# Patient Record
Sex: Male | Born: 1945 | Race: White | Hispanic: No | Marital: Married | Smoking: Never smoker
Health system: Southern US, Community
[De-identification: ages and names within clinical notes are randomized; demographics above are authoritative.]

---

## 2000-02-15 ENCOUNTER — Encounter: Payer: Self-pay | Admitting: Pediatrics

## 2000-02-15 ENCOUNTER — Encounter: Payer: Self-pay | Admitting: Emergency Medicine

## 2000-02-15 ENCOUNTER — Emergency Department (HOSPITAL_COMMUNITY): Admission: EM | Admit: 2000-02-15 | Discharge: 2000-02-15 | Payer: Self-pay | Admitting: Emergency Medicine

## 2004-04-12 ENCOUNTER — Encounter: Admission: RE | Admit: 2004-04-12 | Discharge: 2004-04-12 | Payer: Self-pay | Admitting: Internal Medicine

## 2004-06-15 ENCOUNTER — Encounter (INDEPENDENT_AMBULATORY_CARE_PROVIDER_SITE_OTHER): Payer: Self-pay | Admitting: Specialist

## 2004-06-15 ENCOUNTER — Observation Stay (HOSPITAL_COMMUNITY): Admission: RE | Admit: 2004-06-15 | Discharge: 2004-06-16 | Payer: Self-pay | Admitting: Surgery

## 2005-02-21 ENCOUNTER — Ambulatory Visit: Payer: Self-pay | Admitting: Psychology

## 2005-03-15 ENCOUNTER — Ambulatory Visit: Payer: Self-pay | Admitting: Psychology

## 2005-04-02 ENCOUNTER — Ambulatory Visit: Payer: Self-pay | Admitting: Psychology

## 2005-04-17 ENCOUNTER — Ambulatory Visit: Payer: Self-pay | Admitting: Psychology

## 2005-05-15 ENCOUNTER — Ambulatory Visit: Payer: Self-pay | Admitting: Psychology

## 2005-06-03 IMAGING — US US ABDOMEN COMPLETE
1 series · 13 of 25 positions shown · IV contrast (agent unspecified)
Comparison: none

CLINICAL DATA: Abdominal pain.
ULTRASOUND OF THE ABDOMEN COMPLETE 
3.5 cm shadowing gallstone is seen.  At the gallbladder fundus is a nonmobile 1.3 cm polypoid focus measuring up to 1.3 cm.  This may represent large gallbladder polyp/adenoma although nonspecific.  No invasion of adjacent structures to support primary gallbladder carcinoma is seen.  Gallbladder wall thickness is normal at 2 mm with no sonographic Murphy?s sign.  No dilated intrahepatic nor extrahepatic bile ducts are seen with common bile duct measuring up to 3 mm.  Inferior vena cava is limited for assessment.  Liver, pancreas, abdominal aorta are sonographically normal with maximal abdominal aortic diameter 1.8 cm.  The spleen measures 9 x 12.1 cm with tiny scattered shadowing probable benign calcified granulomata.  The right kidney measures 10.6 cm long with the left kidney measuring 11.2 cm long with no hydronephrosis.  12 mm upper pole right renal cyst and upper pole left renal cyst measuring up to 5.5 cm shows slight internal echoes and is exophytic. 
IMPRESSION
1.  3.5 cm shadowing gallstone with likely gallbladder fundal 1.3 cm soft tissue polypoid nonspecific focus. 
2.  Bilateral renal cysts slightly complex at the upper pole left.  Followup targeted renal ultrasound in six months or abdominal CT pre- and post-contrast are advised for further confirmation of this finding.  
3.  Slight limitation of the inferior vena cava assessment with likely benign splenic granulomata.  
4.  Otherwise normal.

[Series 1: unknown · 0.27mm/px · 13 of 79 slices shown]
[im 1/79]
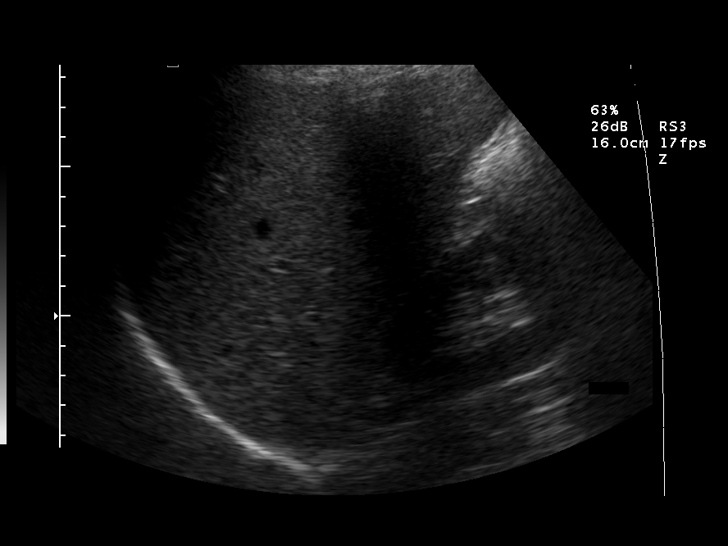
[im 7/79]
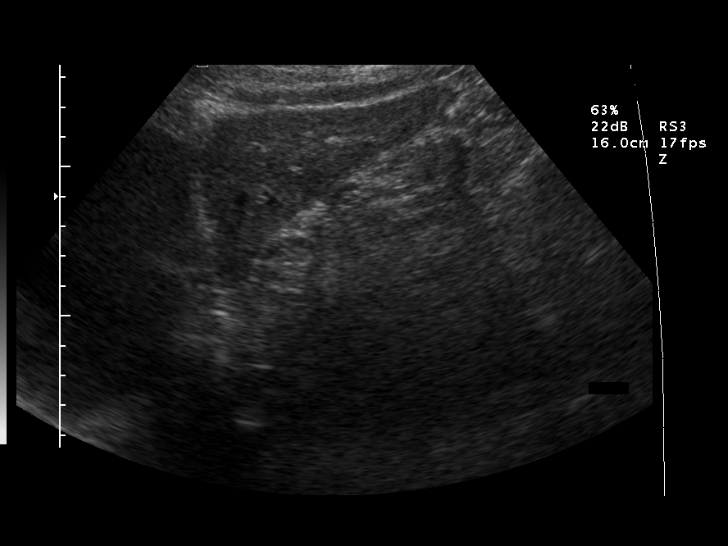
[im 14/79]
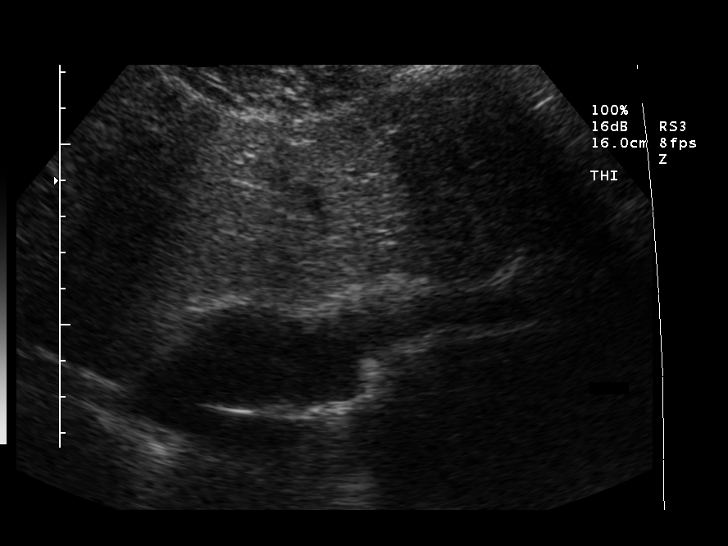
[im 20/79]
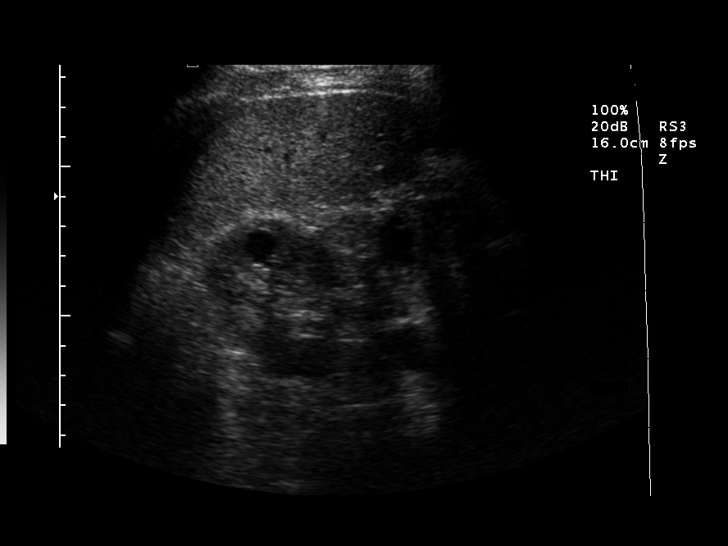
[im 27/79]
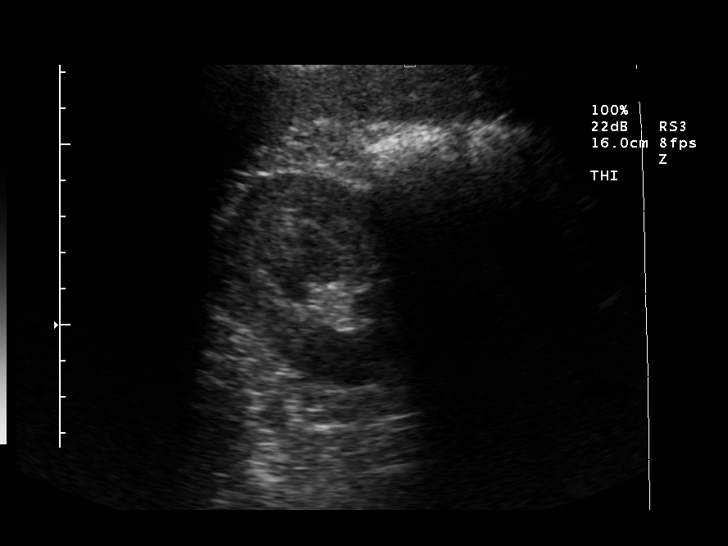
[im 33/79]
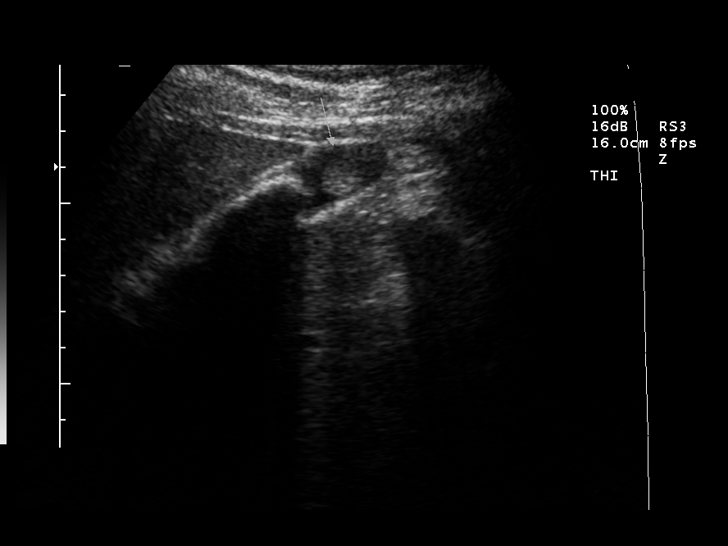
[im 40/79]
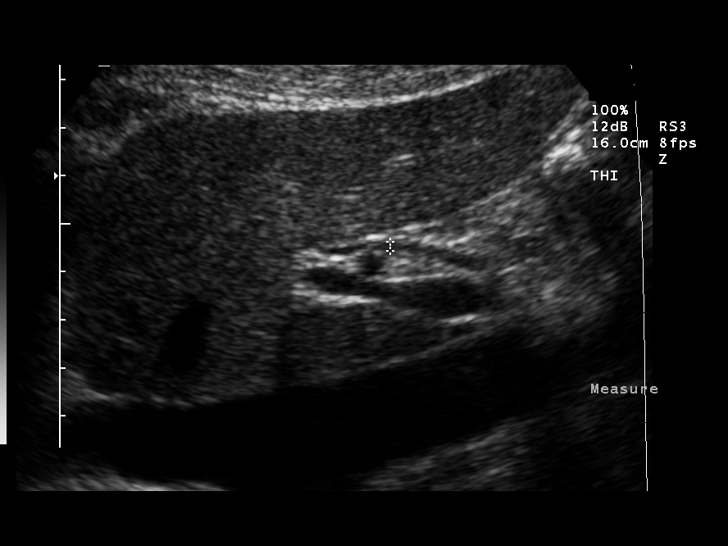
[im 46/79]
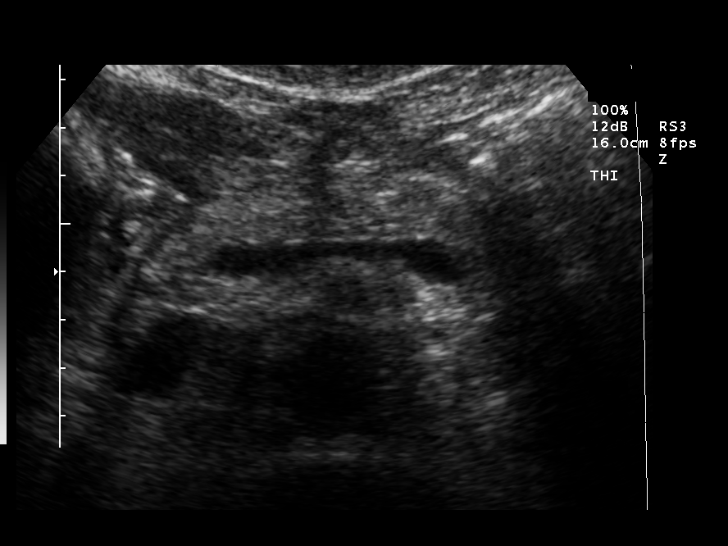
[im 53/79]
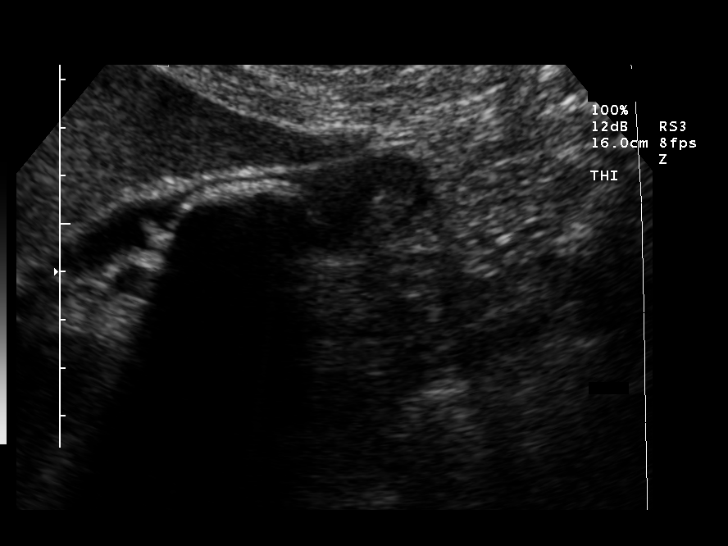
[im 59/79]
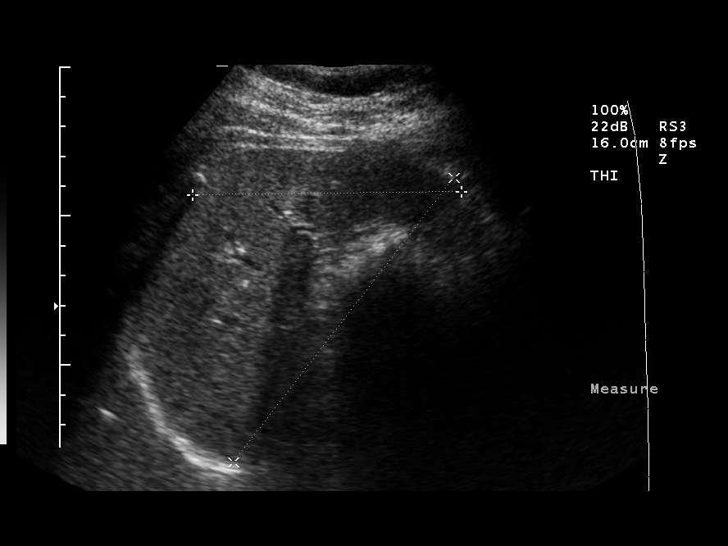
[im 66/79]
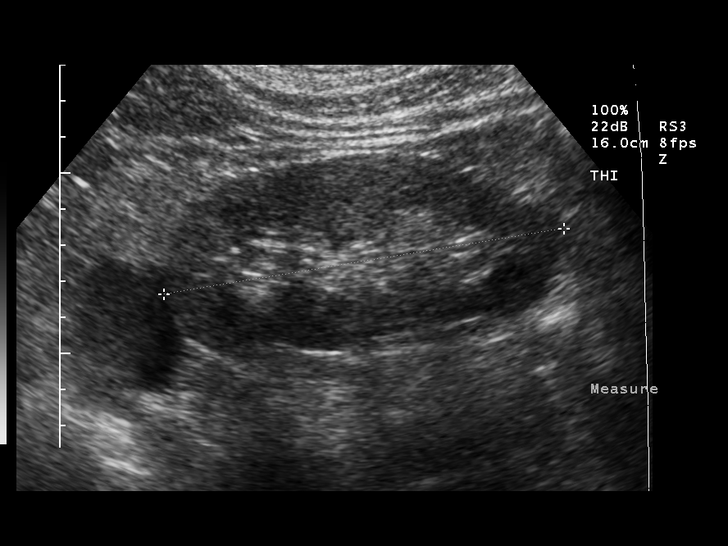
[im 72/79]
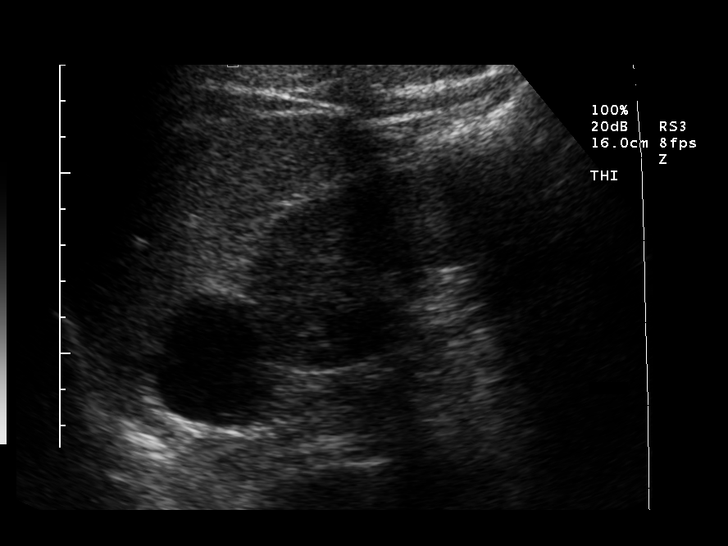
[im 79/79]
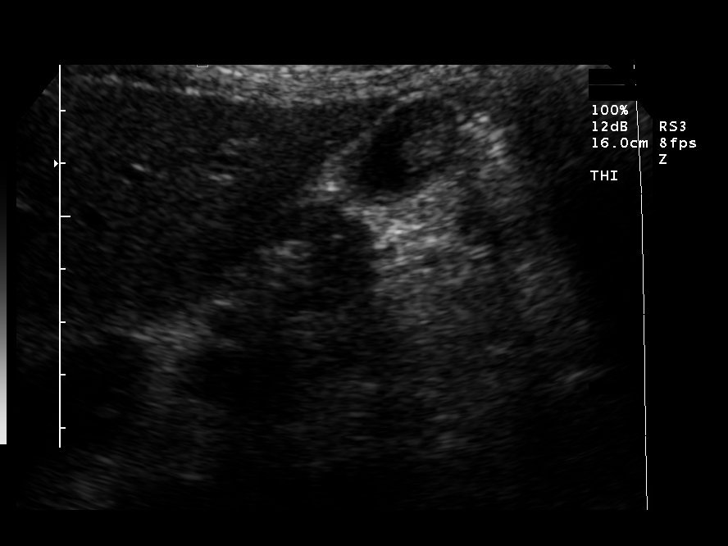

[13 of 25 positions shown; findings below may reference images not displayed]

## 2005-06-12 ENCOUNTER — Ambulatory Visit: Payer: Self-pay | Admitting: Psychology

## 2005-07-03 ENCOUNTER — Ambulatory Visit: Payer: Self-pay | Admitting: Psychology

## 2005-07-31 ENCOUNTER — Ambulatory Visit: Payer: Self-pay | Admitting: Psychology

## 2005-08-06 IMAGING — RF DG CHOLANGIOGRAM OPERATIVE
1 series · 24 of 24 positions shown · non-contrast
Comparison: none

CLINICAL DATA: Symptomatic gallstones.  

INTRAOPERATIVE CHOLANGIOGRAM:
TECHNIQUE: Multiple fluoroscopic spot radiographs were obtained during
intraoperative cholangiogram, and are submitted for interpretation
postoperatively.

[Series 1: run · 24 of 53 slices shown]
[im 1/53]
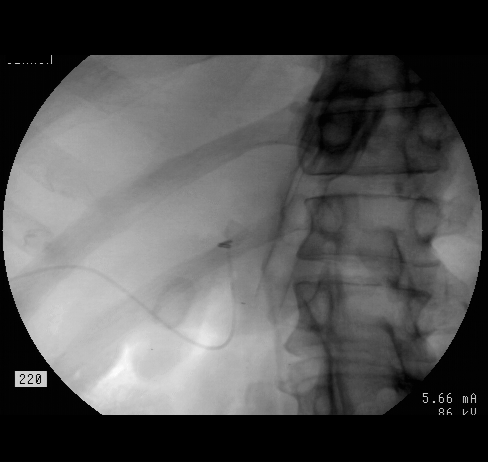
[im 3/53]
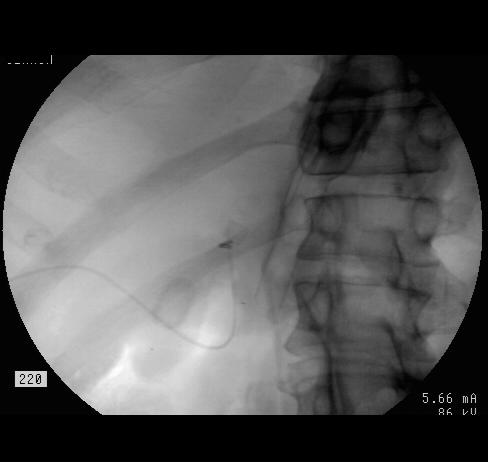
[im 5/53]
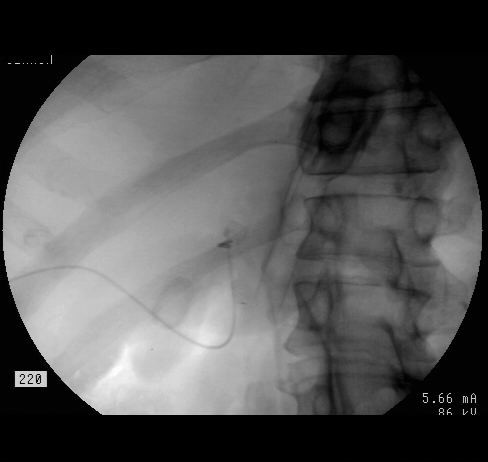
[im 7/53]
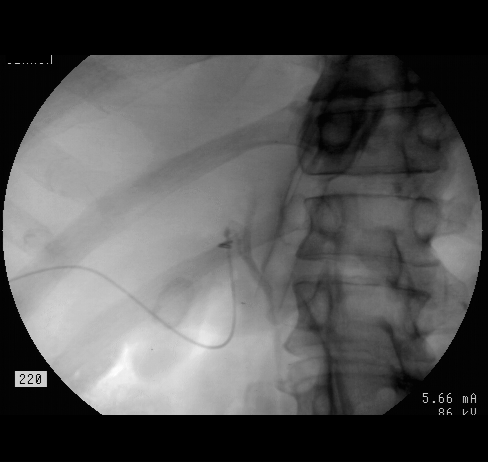
[im 10/53]
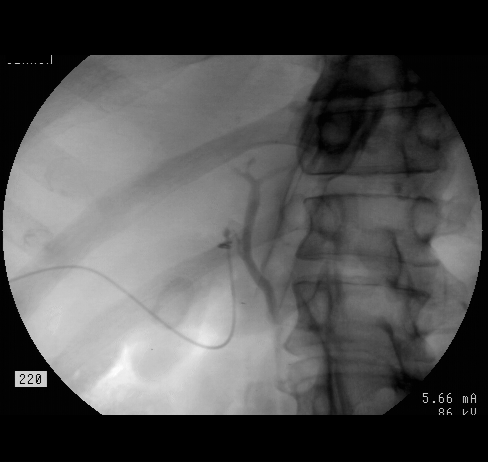
[im 12/53]
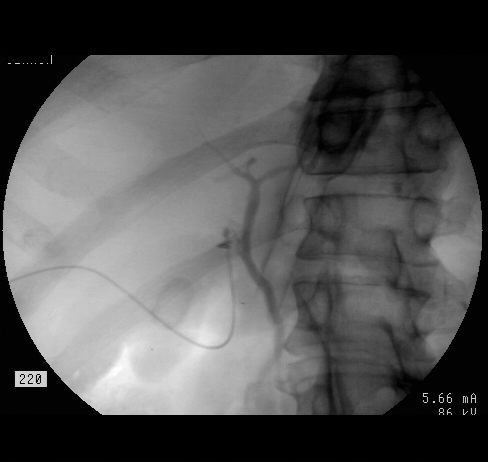
[im 14/53]
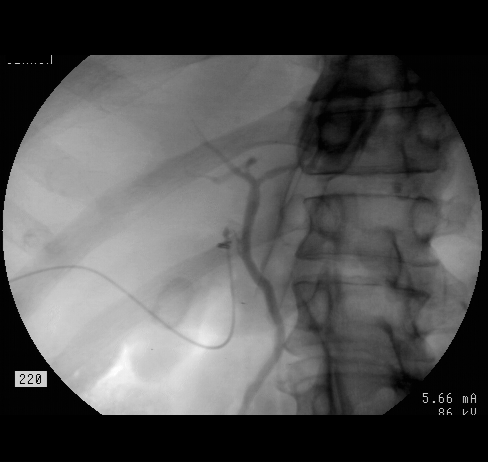
[im 16/53]
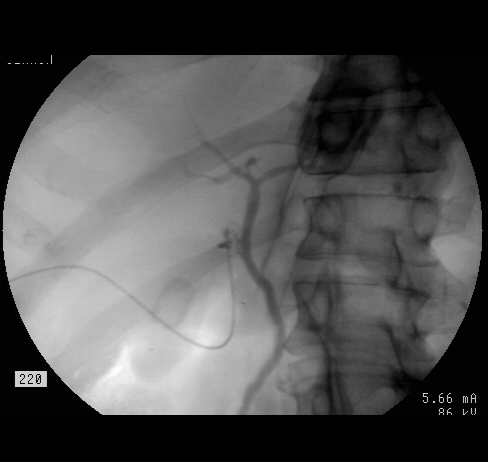
[im 19/53]
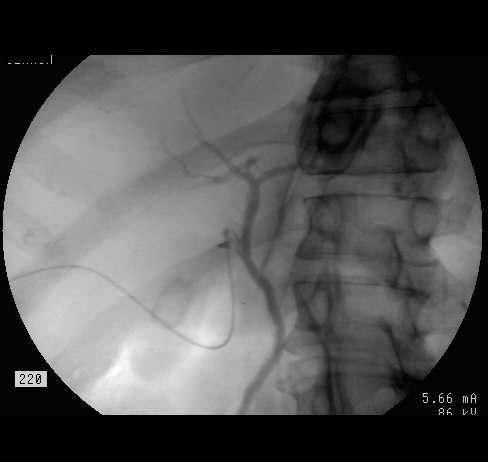
[im 21/53]
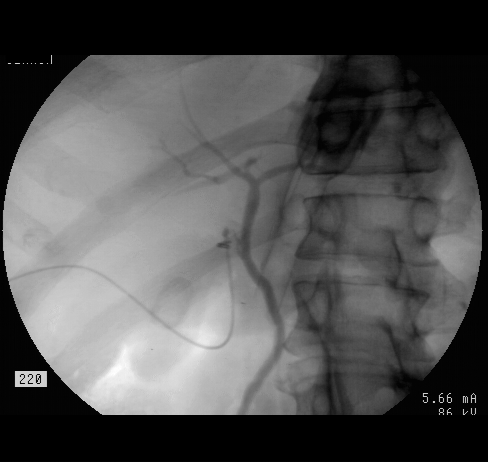
[im 23/53]
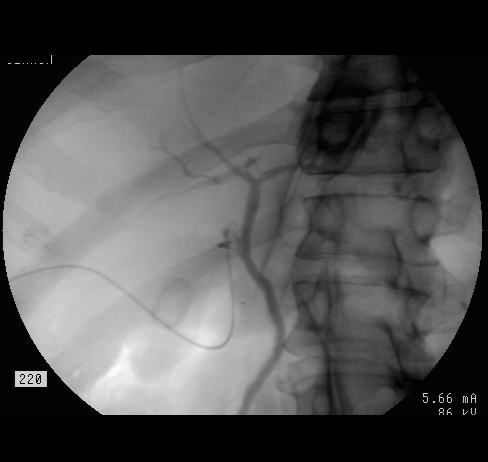
[im 25/53]
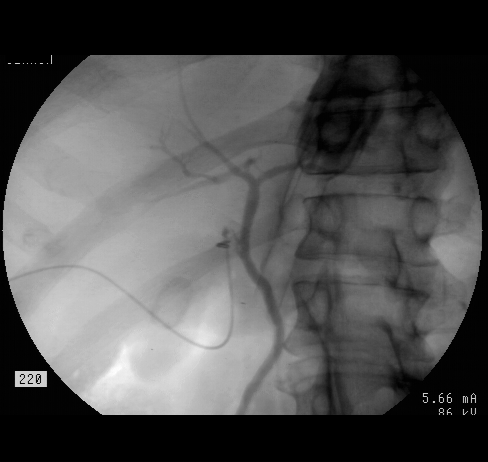
[im 28/53]
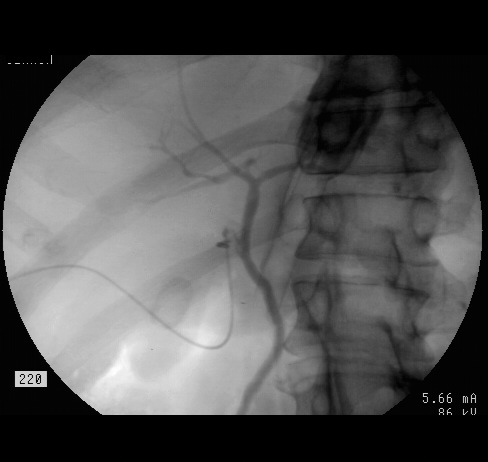
[im 30/53]
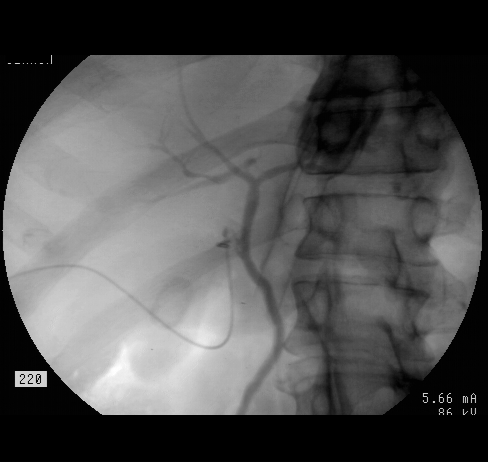
[im 32/53]
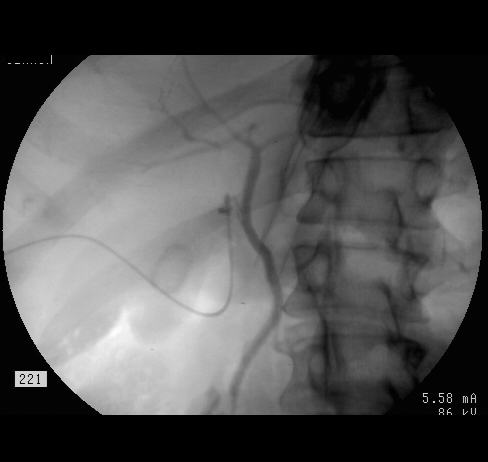
[im 34/53]
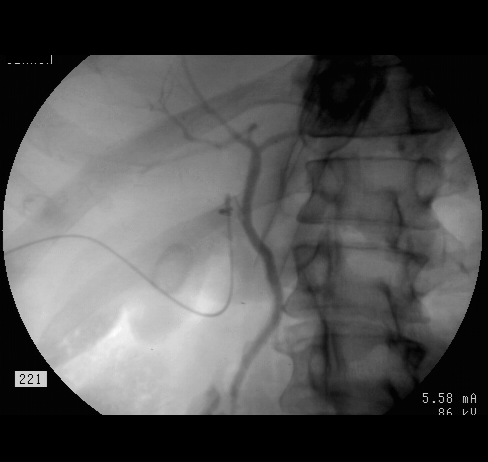
[im 37/53]
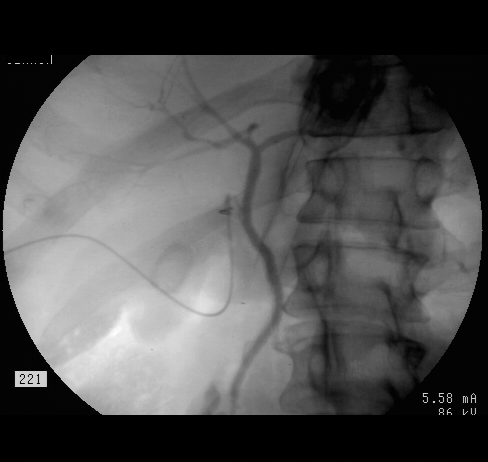
[im 39/53]
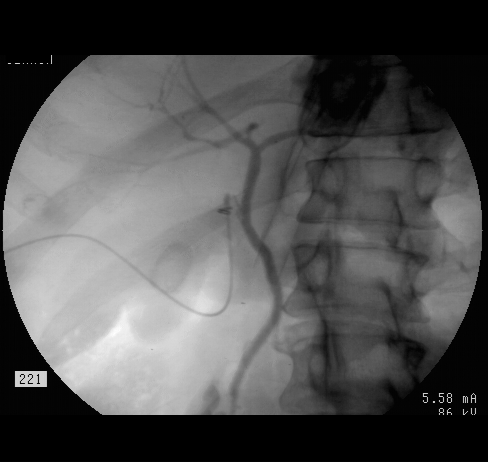
[im 41/53]
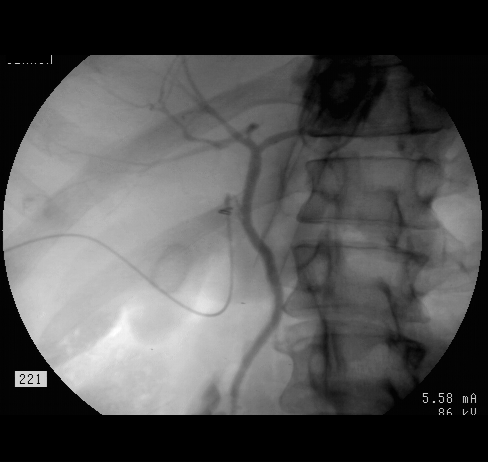
[im 43/53]
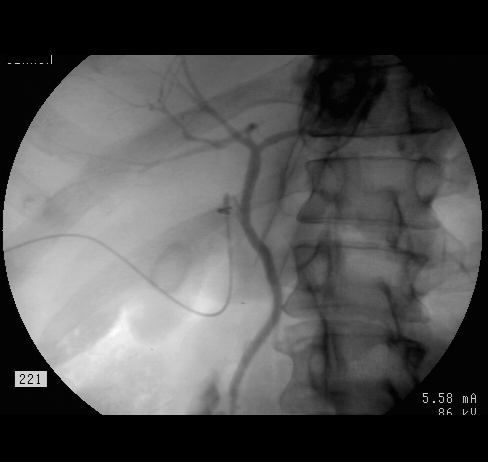
[im 46/53]
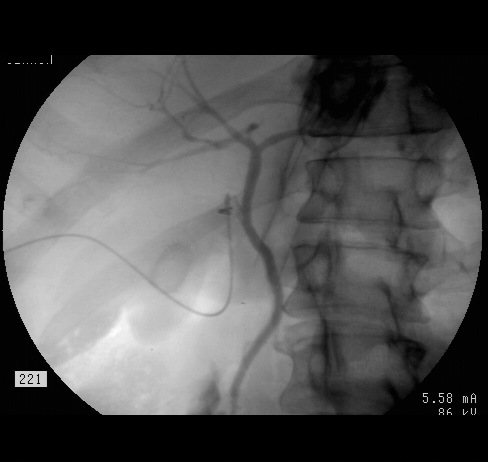
[im 48/53]
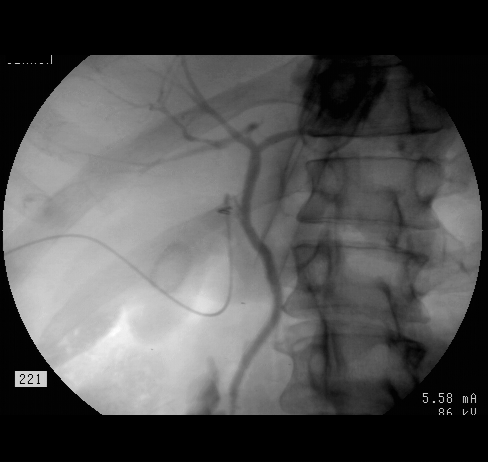
[im 50/53]
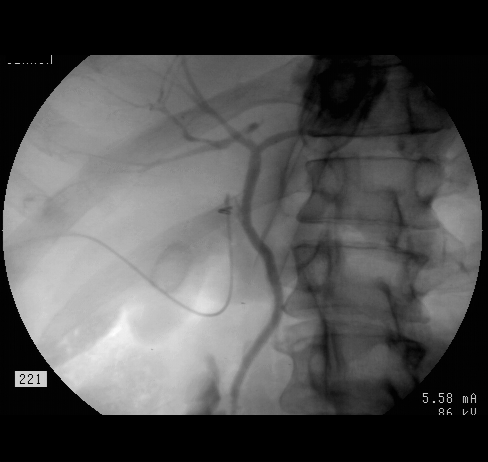
[im 53/53]
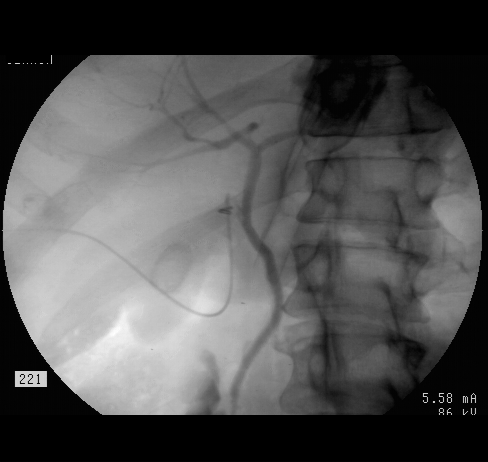

[24 of 24 positions shown; findings below may reference images not displayed]

FINDINGS: No calculi are identified within the common bile duct.  There is no
evidence of biliary stricture or obstruction.
IMPRESSION: Negative intraoperative cholangiogram.

## 2005-08-19 ENCOUNTER — Ambulatory Visit: Payer: Self-pay | Admitting: Psychology

## 2005-09-02 ENCOUNTER — Ambulatory Visit: Payer: Self-pay | Admitting: Psychology

## 2005-09-16 ENCOUNTER — Ambulatory Visit: Payer: Self-pay | Admitting: Psychology

## 2005-09-30 ENCOUNTER — Ambulatory Visit: Payer: Self-pay | Admitting: Psychology

## 2005-10-30 ENCOUNTER — Ambulatory Visit: Payer: Self-pay | Admitting: Psychology

## 2005-11-25 ENCOUNTER — Ambulatory Visit: Payer: Self-pay | Admitting: Psychology

## 2005-12-03 ENCOUNTER — Ambulatory Visit: Payer: Self-pay | Admitting: Psychology

## 2005-12-09 ENCOUNTER — Ambulatory Visit: Payer: Self-pay | Admitting: Psychology

## 2005-12-30 ENCOUNTER — Ambulatory Visit: Payer: Self-pay | Admitting: Psychology

## 2006-01-06 ENCOUNTER — Ambulatory Visit: Payer: Self-pay | Admitting: Psychology

## 2006-01-20 ENCOUNTER — Ambulatory Visit: Payer: Self-pay | Admitting: Psychology

## 2006-02-19 ENCOUNTER — Ambulatory Visit: Payer: Self-pay | Admitting: Psychology

## 2006-03-03 ENCOUNTER — Ambulatory Visit: Payer: Self-pay | Admitting: Psychology

## 2006-03-17 ENCOUNTER — Ambulatory Visit: Payer: Self-pay | Admitting: Psychology

## 2006-04-28 ENCOUNTER — Ambulatory Visit: Payer: Self-pay | Admitting: Psychology

## 2006-05-12 ENCOUNTER — Ambulatory Visit: Payer: Self-pay | Admitting: Psychology

## 2006-05-26 ENCOUNTER — Ambulatory Visit: Payer: Self-pay | Admitting: Psychology

## 2006-06-23 ENCOUNTER — Ambulatory Visit: Payer: Self-pay | Admitting: Psychology

## 2006-07-09 ENCOUNTER — Ambulatory Visit: Payer: Self-pay | Admitting: Psychology

## 2006-08-04 ENCOUNTER — Ambulatory Visit: Payer: Self-pay | Admitting: Psychology

## 2006-08-27 ENCOUNTER — Ambulatory Visit: Payer: Self-pay | Admitting: Psychology

## 2006-09-17 ENCOUNTER — Ambulatory Visit: Payer: Self-pay | Admitting: Psychology

## 2006-10-01 ENCOUNTER — Ambulatory Visit: Payer: Self-pay | Admitting: Psychology

## 2006-10-15 ENCOUNTER — Ambulatory Visit: Payer: Self-pay | Admitting: Psychology

## 2006-10-29 ENCOUNTER — Ambulatory Visit: Payer: Self-pay | Admitting: Psychology

## 2006-11-12 ENCOUNTER — Ambulatory Visit: Payer: Self-pay | Admitting: Psychology

## 2006-11-26 ENCOUNTER — Ambulatory Visit: Payer: Self-pay | Admitting: Psychology

## 2006-12-10 ENCOUNTER — Ambulatory Visit: Payer: Self-pay | Admitting: Psychology

## 2006-12-24 ENCOUNTER — Ambulatory Visit: Payer: Self-pay | Admitting: Psychology

## 2007-01-07 ENCOUNTER — Ambulatory Visit: Payer: Self-pay | Admitting: Psychology

## 2007-02-04 ENCOUNTER — Ambulatory Visit: Payer: Self-pay | Admitting: Psychology

## 2007-02-18 ENCOUNTER — Ambulatory Visit: Payer: Self-pay | Admitting: Psychology

## 2007-03-04 ENCOUNTER — Ambulatory Visit: Payer: Self-pay | Admitting: Psychology

## 2007-03-18 ENCOUNTER — Ambulatory Visit: Payer: Self-pay | Admitting: Psychology

## 2007-04-21 ENCOUNTER — Ambulatory Visit: Payer: Self-pay | Admitting: Psychology

## 2007-05-19 ENCOUNTER — Ambulatory Visit: Payer: Self-pay | Admitting: Psychology

## 2007-06-02 ENCOUNTER — Ambulatory Visit: Payer: Self-pay | Admitting: Psychology

## 2007-06-16 ENCOUNTER — Ambulatory Visit: Payer: Self-pay | Admitting: Psychology

## 2007-07-14 ENCOUNTER — Ambulatory Visit: Payer: Self-pay | Admitting: Psychology

## 2007-07-28 ENCOUNTER — Ambulatory Visit: Payer: Self-pay | Admitting: Psychology

## 2007-08-25 ENCOUNTER — Ambulatory Visit: Payer: Self-pay | Admitting: Psychology

## 2007-09-16 ENCOUNTER — Ambulatory Visit: Payer: Self-pay | Admitting: Psychology

## 2007-09-22 ENCOUNTER — Ambulatory Visit: Payer: Self-pay | Admitting: Psychology

## 2007-10-14 ENCOUNTER — Ambulatory Visit: Payer: Self-pay | Admitting: Psychology

## 2007-11-18 ENCOUNTER — Ambulatory Visit: Payer: Self-pay | Admitting: Psychology

## 2007-12-02 ENCOUNTER — Ambulatory Visit: Payer: Self-pay | Admitting: Psychology

## 2007-12-16 ENCOUNTER — Ambulatory Visit: Payer: Self-pay | Admitting: Psychology

## 2007-12-30 ENCOUNTER — Ambulatory Visit: Payer: Self-pay | Admitting: Psychology

## 2008-01-13 ENCOUNTER — Ambulatory Visit: Payer: Self-pay | Admitting: Psychology

## 2008-01-27 ENCOUNTER — Ambulatory Visit: Payer: Self-pay | Admitting: Psychology

## 2008-03-09 ENCOUNTER — Ambulatory Visit: Payer: Self-pay | Admitting: Psychology

## 2008-03-23 ENCOUNTER — Ambulatory Visit: Payer: Self-pay | Admitting: Psychology

## 2008-04-20 ENCOUNTER — Ambulatory Visit: Payer: Self-pay | Admitting: Psychology

## 2008-05-02 ENCOUNTER — Ambulatory Visit: Payer: Self-pay | Admitting: Psychology

## 2008-05-18 ENCOUNTER — Ambulatory Visit: Payer: Self-pay | Admitting: Psychology

## 2008-05-30 ENCOUNTER — Ambulatory Visit: Payer: Self-pay | Admitting: Psychology

## 2008-06-30 ENCOUNTER — Ambulatory Visit: Payer: Self-pay | Admitting: Psychology

## 2008-07-11 ENCOUNTER — Ambulatory Visit: Payer: Self-pay | Admitting: Psychology

## 2008-07-25 ENCOUNTER — Ambulatory Visit: Payer: Self-pay | Admitting: Psychology

## 2008-08-08 ENCOUNTER — Ambulatory Visit: Payer: Self-pay | Admitting: Psychology

## 2008-08-22 ENCOUNTER — Ambulatory Visit: Payer: Self-pay | Admitting: Psychology

## 2008-09-27 ENCOUNTER — Ambulatory Visit: Payer: Self-pay | Admitting: Psychology

## 2008-10-17 ENCOUNTER — Ambulatory Visit: Payer: Self-pay | Admitting: Psychology

## 2008-11-14 ENCOUNTER — Ambulatory Visit: Payer: Self-pay | Admitting: Psychology

## 2008-11-30 ENCOUNTER — Ambulatory Visit: Payer: Self-pay | Admitting: Psychology

## 2008-12-14 ENCOUNTER — Ambulatory Visit: Payer: Self-pay | Admitting: Psychology

## 2008-12-29 ENCOUNTER — Ambulatory Visit: Payer: Self-pay | Admitting: Psychology

## 2009-01-10 ENCOUNTER — Ambulatory Visit: Payer: Self-pay | Admitting: Psychology

## 2009-01-26 ENCOUNTER — Ambulatory Visit: Payer: Self-pay | Admitting: Psychology

## 2009-02-08 ENCOUNTER — Ambulatory Visit: Payer: Self-pay | Admitting: Psychology

## 2009-02-22 ENCOUNTER — Ambulatory Visit: Payer: Self-pay | Admitting: Psychology

## 2009-03-06 ENCOUNTER — Ambulatory Visit: Payer: Self-pay | Admitting: Psychology

## 2009-03-20 ENCOUNTER — Ambulatory Visit: Payer: Self-pay | Admitting: Psychology

## 2009-04-19 ENCOUNTER — Ambulatory Visit: Payer: Self-pay | Admitting: Psychology

## 2009-05-17 ENCOUNTER — Ambulatory Visit: Payer: Self-pay | Admitting: Psychology

## 2009-05-31 ENCOUNTER — Ambulatory Visit: Payer: Self-pay | Admitting: Psychology

## 2009-07-26 ENCOUNTER — Ambulatory Visit: Payer: Self-pay | Admitting: Psychology

## 2009-08-21 ENCOUNTER — Ambulatory Visit: Payer: Self-pay | Admitting: Psychology

## 2009-09-18 ENCOUNTER — Ambulatory Visit: Payer: Self-pay | Admitting: Psychology

## 2009-10-02 ENCOUNTER — Ambulatory Visit: Payer: Self-pay | Admitting: Psychology

## 2009-10-30 ENCOUNTER — Ambulatory Visit: Payer: Self-pay | Admitting: Psychology

## 2009-11-30 ENCOUNTER — Ambulatory Visit (HOSPITAL_COMMUNITY): Admission: RE | Admit: 2009-11-30 | Discharge: 2009-11-30 | Payer: Self-pay | Admitting: Gastroenterology

## 2009-12-11 ENCOUNTER — Ambulatory Visit: Payer: Self-pay | Admitting: Psychology

## 2010-01-08 ENCOUNTER — Ambulatory Visit: Payer: Self-pay | Admitting: Psychology

## 2010-12-14 NOTE — Consult Note (Signed)
[facility]  Patient:    Carlos Mason, Carlos Mason                         MRN: [id number] Proc. Date: 02/15/00 Adm. Date:  [id number] Attending:  Mick Sell CC:         Pearla Dubonnet, M.D.                          Consultation Report  DATE OF BIRTH:  [birth year]  CHIEF COMPLAINT:  Slurred speech, unsteady gait.  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 The patient has had labile hypertension and depression.  He had 5 hours of sleep last night and got up early to drive to [place] for the weekend wit his brother.  His wife and son were in the car.  Less than an hour into the trip his wife and son had fallen asleep and he pulled over to tell her that he felt very tired and was unable to drive.  They switched places and she noted that his speech was slurred and that he seemed to confused.  He continued to ask "are at we at the [facility] yet"?  His wife became alarmed and turned the car around.  At that point he asked if the family was going to [place].  When the patient came to [facility] he seemed to be unsteady on his feet and had slurred speech.  He was evaluated by the emergency department physician who found evidence of dysarthria, difficulty following instructions, somnolence, but no focal deficits.  The patient had a CT scan of the brain which I have evaluated personally and was normal.  I was asked to see him and recommended code stroke status with work-up for evidence of possible cerebrovascular accident.  Through the course of the morning, the patient has become more lucid.  He still shows some evidence of problems with distractability, but these are premorbid characteristics.  He also shows some mild left facial weakness which he says and he dates this condition by about 30 years.  He has become steady on  his feet and did well on a mini-mental status examination.  PAST MEDICAL HISTORY:  Remarkable for depression, treated with Wellbutrin SR and more recently with clonazepam for side effects of early anxiety.  Patient had palpitation which have been treated in the past with a beta blocker though he is not currently treated with that.  The patient denies recent use of tobacco (quit 15 years ago), alcohol and does not have persistent hypertension, diabetes or hypercholesterolemia.  The patient exercises by running 5 days a week.  SURGICAL HISTORY:  Tonsillectomy age 25.  REVIEW OF SYSTEMS:  The patient has not had until today fatigability or weakness, change in weight or appetite, problems with sleep although he frequently sleeps less than he needs.  He has had no intercurrent infections and specifically no fevers, chills, night sweats.  He has not complained of headache, nausea or vomiting, diarrhea, shortness of breath or dyspnea nor has he had cough.  No chest pain palpitations.  No nausea or vomiting, diarrhea. No change in bowel or bladder function.  No bleeding.  He has chronic back pain.  Neurologically he has had no other complaints except for those noted above.  CURRENT MEDICATIONS: 1. Wellbutrin  SR 150 in the morning SR 100 in the evening. 2. Clonopin 0.25 mg 6 times a day. 3. P.r.n. Claritin and Nasacort both of which he took today. 4. He also has taken a beta blocker in the past for palpitations.  FAMILY HISTORY:  Mother died of Alzheimers disease.  She also had hypertension.  Paternal grandfather died of a stroke.  Maternal grandmother died of some form of infection.  The patients father died of emphysema at 30. Paternal grandfather died in his 66s and paternal grandmother died in her 85s of unknown causes.  He has a son 65.  SOCIAL HISTORY:  The patient lives in [place] and works as a Paramedic at [employer].  Lives with his wife and son.  Again, no  recent use of tobacco or alcohol.  He has occasional use of caffeine.  He does not use recreational drugs.  PHYSICAL EXAMINATION:  GENERAL:  This is a pleasant gentleman in no acute distress.  Lying on a stretcher.  VITAL SIGNS:  Temperature 98.1, blood pressure 158/82, resting pulse 88 and respirations 19.  Pulse oximetry 98%.  Height 5 feet 9 inches.  WEight 182 pounds.  HEENT:  No signs of infection.  NECK:  Supple.  A slight decreased range of motion but no meningismus.  No cranial or cervical bruits.  LUNGS:  Clear to auscultation.  HEART:  No murmurs.  Pulse is normal.  Regular rate and rhythm.  ABDOMEN:  Soft, nontender, bowel sounds normal.  EXTREMITIES:  Well formed without edema, cyanosis, alterations in tone or tight heel cords.  NEUROLOGIC:  Mental status:  The patient was awake, alert, attentive, and appropriate.  He could recall 3/3 objects at 30 seconds, 2/3 at 5 minutes.  He could spell the word "world" backwards and forwards correctly.  He was able to subtract 7s serially from 100 with one error which he was able to self correct.  The patient was able to name objects, repeat phrases, read and follow phrases, perform 3 step commands, make constructions and write a sentence.  His mini-mental status exam was at least 29 out of 30.  CRANIAL NERVE EXAMINATION:  Round, reactive pupils, normal fundi, visual fields full to double simultaneous stimuli.  Extraocular movements full and conjugate.  Okay in responses equal bilaterally.  He has some mild left central 7th which he says is at least 66 years old, symmetric facial strength, midline tongue and uvula, air conduction greater than bone conduction bilaterally.  Motor examination:  The patient showed normal strength, tone and mass fine motor movements, no pronator drift.  Sensation intact to cold, vibration, stereoagnosis.  Cerebellar examination good finger-to-nose and rapid repetitive movements.  Gait and  station was normal.  Patient was able to walk on heels and toes and form a tandem without falling.  He could get up on his  heels and toes.  Romberg responses negative.  Deep tendon reflexes are symmetric, normal at the biceps, patella, diminished elsewhere. The patient had bilateral flexor plantar responses.  IMPRESSION:  Delirium, unknown etiology.  The patient had slurred speech, unsteady gait but no focal or lateralized findings.  This could represent a vertebrobasilar TIA, a postictal state (no seizures were seen), drug induced toxic condition.  Family is uncertain that he did not take too many medicines. I suppose he could have just been extremely tired although I like that explanation less than any of the others.  I reviewed an MRI scan of the brain without and with  contrast personally and find it to be normal.  MR angiogram is also normal.  The patient had somewhat prominent jugular veins but we had this reviewed with a radiologist who agreed that no further work-up in this area was necessary.  CBC shows a normal sinus rhythm with occasional PVCs, left atrial enlargement, rightward axis and an incomplete bundle branch block.  White blood cell count 8,700, hemoglobin 13.9, hematocrit 38.8, MCV 82.8, platelet count 257,000.  Neutrophils 69, lymphocytes 20, monocytes 6, eosinophils 2, basophils 0, LUC 3, sodium 147, potassium 4.0, chloride 99, CO2 28, glucose 126, BUN 20, creatinine 1.1, calcium 9.0, total protein 7.1, albumin 4.2, SGOT 36, SGPT 29, alk. phos. 24, total bilirubin 1.2,.  DISPOSITION:  The patient is discharged home in improved condition.  I have discussed with his wife the need to bring him back for further evaluation should delirium occur.  Under those circumstances in all likelihood we would carry out an EEG.  We will carry out a 2-D echocardiogram as an outpatient. He will start on aspirin 325 mg per day.  FOLLOWUP:  A return visit to see Pearla Dubonnet, M.D. for further followup and I will see the patient at Dr. Kevan Ny request should be there any change in his condition.  This has been discussed with the patient and his wife and all are in agreement.  DD:  02/15/00 TD:  02/16/00 Job: [id number]

## 2010-12-14 NOTE — Op Note (Signed)
NAMESNEIJDER, Carlos Mason                ACCOUNT NO.:  [id number]   MEDICAL RECORD NO.:  [id number]          PATIENT TYPE:  AMB   LOCATION:  DAY                          FACILITY:  [facility]   PHYSICIAN:  Velora Heckler, MD      DATE OF BIRTH:  1946-03-02   DATE OF PROCEDURE:  06/15/2004  DATE OF DISCHARGE:                                 OPERATIVE REPORT   PREOPERATIVE DIAGNOSES:  1.  Cholelithiasis.  2.  Gallbladder polyp.   POSTOPERATIVE DIAGNOSES:  1.  Cholelithiasis.  2.  Gallbladder polyp.   PROCEDURE:  Laparoscopic cholecystectomy with intraoperative  cholangiography.   SURGEON:  Velora Heckler, MD   ASSISTANT:  Abigail Miyamoto, MD   ANESTHESIA:  General.   ESTIMATED BLOOD LOSS:  Minimal.   PREPARATION:  Betadine.   COMPLICATIONS:  None.   INDICATIONS:  The patient is a 65 year old white male, referred by Dr.  Johnella Moloney with longstanding history of gallstones.  The patient has had  intermittent abdominal discomfort.  Ultrasound showed a 3.5 cm gallstone.  There did appear to be a 1.3 cm polyp in the gallbladder.  The patient now  comes to surgery for cholecystectomy.   DESCRIPTION OF PROCEDURE:  The procedure is done in OR #1 at the [facility].  The patient is brought to the operating room, placed in  a supine position on the operating room table.  Following administration of  general anesthesia, the patient is prepped and draped in the usual strict  aseptic fashion.  After ascertaining that an adequate level of anesthesia  had been obtained, an infraumbilical incision is made transversely with a  #15 blade.  Dissection is carried down through the subcutaneous tissues.  Fascia is incised in the midline, and the peritoneal cavity is entered  cautiously.  A 0 Vicryl pursestring suture is placed in the fascia.  An  Hasson cannula is introduced and secured with a pursestring suture.  Abdomen  is insufflated with carbon dioxide.  Laparoscope is  introduced and the  abdomen explored.  Operative ports are placed along the right costal margin  in the midline, midclavicular line, and anterior axillary line.  Fundus of  the gallbladder is grasped and retracted cephalad.  Peritoneum at the neck  of the gallbladder is incised.  Cystic duct is dissected out along its  length.  A clip is placed at the neck of the gallbladder.  Cystic duct is  incised.  Clear bile emanates from the cystic duct.  A Cook cholangiography  catheter is introduced through a stab wound in the right upper quadrant.  It  is inserted into the cystic duct and secured with a Ligaclip.  Using C-arm  fluoroscopy, real-time cholangiography is performed.  There is rapid filling  of a normal caliber biliary tree.  There is free flow distally into the  duodenum without filling defect or obstruction.  There is reflux of contrast  into the right and left hepatic ductal systems.  Clip is withdrawn, and the  [facility] catheter is removed from the peritoneal cavity.  Cystic duct  is triply  clipped and divided.  Cystic artery is dissected out, doubly clipped, and  divided.  A prominent venous tributary to the gallbladder is also dissected  out, doubly clipped, and divided.  The gallbladder is then excised from the  gallbladder bed using the hook electrocautery for hemostasis.  Good  hemostasis is noted along the gallbladder bed.  Clips appear to be in good  position.  Gallbladder is completely excised and extracted through the  umbilical port without difficulty.  It contains 1 large gallstone.  It is  submitted to pathology for review.  Good hemostasis is noted.  Ports are  removed under direct vision.  The 0 Vicryl pursestring suture is tied  securely.  Pneumoperitoneum is released.  All wounds are anesthetized with  local anesthetic.  All wounds are closed with interrupted 4-0 Vicryl  subcuticular sutures.  Wounds are washed and dried, and Steri-Strips are  applied.  Sterile dressings  are applied.  The patient is awakened from  anesthesia and brought to the recovery room in stable condition.  The  patient tolerated the procedure well.     Todd   TMG/MEDQ  D:  06/15/2004  T:  06/15/2004  Job:  [id number]   cc:   Candyce Churn, M.D.  [address] [place]  [place] [address]  Fax: [phone]

## 2012-01-07 ENCOUNTER — Other Ambulatory Visit: Payer: Self-pay | Admitting: Dermatology

## 2012-11-18 ENCOUNTER — Other Ambulatory Visit: Payer: Self-pay | Admitting: Dermatology

## 2014-01-12 ENCOUNTER — Other Ambulatory Visit: Payer: Self-pay | Admitting: Dermatology

## 2015-01-10 ENCOUNTER — Other Ambulatory Visit: Payer: Self-pay | Admitting: Dermatology

## 2017-02-11 ENCOUNTER — Other Ambulatory Visit: Payer: Self-pay | Admitting: Dermatology

## 2020-11-28 ENCOUNTER — Other Ambulatory Visit: Payer: Self-pay

## 2020-11-28 ENCOUNTER — Encounter (INDEPENDENT_AMBULATORY_CARE_PROVIDER_SITE_OTHER): Payer: Self-pay

## 2020-11-28 ENCOUNTER — Encounter: Payer: Self-pay | Admitting: Dermatology

## 2020-11-28 ENCOUNTER — Ambulatory Visit (INDEPENDENT_AMBULATORY_CARE_PROVIDER_SITE_OTHER): Payer: Medicare Other | Admitting: Dermatology

## 2020-11-28 DIAGNOSIS — Z85828 Personal history of other malignant neoplasm of skin: Secondary | ICD-10-CM | POA: Diagnosis not present

## 2020-11-28 DIAGNOSIS — D485 Neoplasm of uncertain behavior of skin: Secondary | ICD-10-CM

## 2020-11-28 DIAGNOSIS — C44612 Basal cell carcinoma of skin of right upper limb, including shoulder: Secondary | ICD-10-CM | POA: Diagnosis not present

## 2020-11-28 NOTE — Patient Instructions (Signed)

## 2020-12-06 ENCOUNTER — Telehealth: Payer: Self-pay | Admitting: *Deleted

## 2020-12-06 NOTE — Telephone Encounter (Signed)
Tried calling patient to give pathology results, no answer no voicemail.

## 2020-12-06 NOTE — Telephone Encounter (Signed)
-----   Message from [name], MD sent at 12/06/2020  5:36 AM EDT ----- Schedule surgery with Dr. Darene Lamer

## 2020-12-10 NOTE — Progress Notes (Signed)
   Follow-Up Visit   Subjective  Carlos Mason is a 75 y.o. male who presents for the following: Skin Problem (Here to have spot on right forearm. X 6 weeks. Will no heal up. Does bleed. PCP said he needed a dermatologist. ).  New nonhealing lesion right forearm Location:  Duration:  Quality:  Associated Signs/Symptoms: Modifying Factors:  Severity:  Timing: Context: History of nonmelanoma skin cancer  Objective  Well appearing patient in no apparent distress; mood and affect are within normal limits. Objective  Right Forearm - Posterior: 9 mm pearly somewhat fibrotic nodule typical of BCC       Objective  Head - Anterior (Face): Strict previous nonmelanoma skin cancer.  No sign recurrence    A focused examination was performed including Head, neck, arms. Relevant physical exam findings are noted in the Assessment and Plan.   Assessment & Plan    Neoplasm of uncertain behavior of skin Right Forearm - Posterior  Skin / nail biopsy Type of biopsy: tangential   Informed consent: discussed and consent obtained   Timeout: patient name, date of birth, surgical site, and procedure verified   Procedure prep:  Patient was prepped and draped in usual sterile fashion (Non sterile) Prep type:  Chlorhexidine Anesthesia: the lesion was anesthetized in a standard fashion   Anesthetic:  1% lidocaine w/ epinephrine 1-100,000 local infiltration Instrument used: flexible razor blade   Hemostasis achieved with: ferric subsulfate   Outcome: patient tolerated procedure well   Post-procedure details: sterile dressing applied and wound care instructions given   Dressing type: bandage and petrolatum    Specimen 1 - Surgical pathology Differential Diagnosis: R/O BCC vs SCC  Check Margins: No  Personal history of skin cancer Head - Anterior (Face)  Annual skin examination.      I, Carlos Monarch, MD, have reviewed all documentation for this visit.  The documentation on  12/10/20 for the exam, diagnosis, procedures, and orders are all accurate and complete.

## 2020-12-19 ENCOUNTER — Telehealth: Payer: Self-pay | Admitting: Dermatology

## 2020-12-19 NOTE — Telephone Encounter (Signed)
Patient is calling for pathology results from last visit with Stuart Tafeen, MD 

## 2020-12-19 NOTE — Telephone Encounter (Signed)
Called both numbers needs 30 with ST

## 2020-12-21 ENCOUNTER — Telehealth: Payer: Self-pay

## 2020-12-21 NOTE — Telephone Encounter (Signed)
Phone call to patient at both numbers listed.  Unable to leave a message for the patient.

## 2020-12-21 NOTE — Telephone Encounter (Signed)
-----   Message from [name], MD sent at 12/06/2020  5:36 AM EDT ----- Schedule surgery with Dr. Darene Lamer

## 2021-01-08 ENCOUNTER — Telehealth: Payer: Self-pay

## 2021-01-08 NOTE — Telephone Encounter (Signed)
Phone call to patient with his pathology results. Unable to leave a message.

## 2021-01-08 NOTE — Telephone Encounter (Signed)
-----   Message from [name], MD sent at 12/06/2020  5:36 AM EDT ----- Schedule surgery with Dr. Darene Lamer

## 2021-01-09 ENCOUNTER — Telehealth: Payer: Self-pay

## 2021-01-09 NOTE — Telephone Encounter (Signed)
-----   Message from [name], MD sent at 12/06/2020  5:36 AM EDT ----- Schedule surgery with Dr. Darene Lamer

## 2021-01-09 NOTE — Telephone Encounter (Signed)
Phone call from patient returning our call regarding his pathology results. Patient aware of results.

## 2021-01-25 ENCOUNTER — Other Ambulatory Visit: Payer: Self-pay

## 2021-01-25 ENCOUNTER — Encounter: Payer: Medicare Other | Admitting: Dermatology

## 2021-01-25 ENCOUNTER — Ambulatory Visit (INDEPENDENT_AMBULATORY_CARE_PROVIDER_SITE_OTHER): Payer: Medicare Other | Admitting: Dermatology

## 2021-01-25 DIAGNOSIS — C44612 Basal cell carcinoma of skin of right upper limb, including shoulder: Secondary | ICD-10-CM

## 2021-01-25 DIAGNOSIS — L570 Actinic keratosis: Secondary | ICD-10-CM | POA: Diagnosis not present

## 2021-01-25 NOTE — Patient Instructions (Signed)

## 2021-02-11 ENCOUNTER — Encounter: Payer: Self-pay | Admitting: Dermatology

## 2021-02-11 NOTE — Progress Notes (Signed)
   Follow-Up Visit   Subjective  Carlos Mason is a 75 y.o. male who presents for the following: Procedure (Treatment of right forearm BCC).  BCC right arm plus new crust Location:  Duration:  Quality:  Associated Signs/Symptoms: Modifying Factors:  Severity:  Timing: Context:   Objective  Well appearing patient in no apparent distress; mood and affect are within normal limits. Right Forearm - Posterior Lesion identified in room by Dr.Vinson Tietze and nurse in room.   Left Upper Cutaneous Lip Hornlike plus gritty for millimeter pink crust    A focused examination was performed including head and neck. Relevant physical exam findings are noted in the Assessment and Plan.   Assessment & Plan    Basal cell carcinoma (BCC) of skin of right upper extremity including shoulder Right Forearm - Posterior  Destruction of lesion Complexity: simple   Destruction method: electrodesiccation and curettage   Informed consent: discussed and consent obtained   Timeout:  patient name, date of birth, surgical site, and procedure verified Anesthesia: the lesion was anesthetized in a standard fashion   Anesthetic:  1% lidocaine w/ epinephrine 1-100,000 local infiltration Curettage performed in three different directions: Yes   Electrodesiccation performed over the curetted area: Yes   Curettage cycles:  3 Lesion length (cm):  1.5 Lesion width (cm):  1.5 Margin per side (cm):  0 Final wound size (cm):  1.5 Hemostasis achieved with:  ferric subsulfate and electrodesiccation Outcome: patient tolerated procedure well with no complications   Post-procedure details: sterile dressing applied and wound care instructions given   Dressing type: bandage and petrolatum   Additional details:  Wound innoculated with 5 fluorouracil solution.  AK (actinic keratosis) Left Upper Cutaneous Lip  Destruction of lesion - Left Upper Cutaneous Lip Complexity: simple   Destruction method: cryotherapy    Informed consent: discussed and consent obtained   Timeout:  patient name, date of birth, surgical site, and procedure verified Lesion destroyed using liquid nitrogen: Yes   Cryotherapy cycles:  3 Outcome: patient tolerated procedure well with no complications        I, [name], MD, have reviewed all documentation for this visit.  The documentation on 02/11/21 for the exam, diagnosis, procedures, and orders are all accurate and complete.

## 2021-05-28 ENCOUNTER — Ambulatory Visit: Payer: Medicare Other | Admitting: Dermatology

## 2021-11-06 ENCOUNTER — Ambulatory Visit (INDEPENDENT_AMBULATORY_CARE_PROVIDER_SITE_OTHER): Payer: Medicare Other | Admitting: Dermatology

## 2021-11-06 DIAGNOSIS — D485 Neoplasm of uncertain behavior of skin: Secondary | ICD-10-CM

## 2021-11-06 DIAGNOSIS — C44619 Basal cell carcinoma of skin of left upper limb, including shoulder: Secondary | ICD-10-CM | POA: Diagnosis not present

## 2021-11-06 DIAGNOSIS — C44319 Basal cell carcinoma of skin of other parts of face: Secondary | ICD-10-CM | POA: Diagnosis not present

## 2021-11-06 DIAGNOSIS — Z85828 Personal history of other malignant neoplasm of skin: Secondary | ICD-10-CM

## 2021-11-06 DIAGNOSIS — Z1283 Encounter for screening for malignant neoplasm of skin: Secondary | ICD-10-CM | POA: Diagnosis not present

## 2021-11-06 NOTE — Patient Instructions (Signed)

## 2021-11-13 ENCOUNTER — Telehealth: Payer: Self-pay | Admitting: *Deleted

## 2021-11-13 NOTE — Telephone Encounter (Signed)
Phone call to patient for path report but no answer. Will try back later.  ?

## 2021-11-13 NOTE — Telephone Encounter (Signed)
-----   Message from [name], MD sent at 11/13/2021  6:51 AM EDT ----- ?I can treat these in 2 visits, but he now lives a good distance away.  If he would prefer, we can arrange with a Mohs surgeon closer to him for the face lesion. ?

## 2021-11-25 ENCOUNTER — Encounter: Payer: Self-pay | Admitting: Dermatology

## 2021-11-25 NOTE — Progress Notes (Signed)
? ?  Follow-Up Visit ?  ?Subjective  ?Carlos Mason is a 76 y.o. male who presents for the following: Annual Exam (Yearly skin exam. History of non mole skin cancers. ). ? ?General skin examination, several spots of concern ?Location:  ?Duration:  ?Quality:  ?Associated Signs/Symptoms: ?Modifying Factors:  ?Severity:  ?Timing: ?Context:  ? ?Objective  ?Well appearing patient in no apparent distress; mood and affect are within normal limits. ?Waist up skin exam: No atypical pigmented lesions (all checked with dermoscopy), no recurrent nonmelanoma skin cancer.  2 possible new basal cell carcinomas face and arm will be biopsied. ? ?Left Upper Arm - Posterior ?Pearly focally eroded 6 mm papule ? ? ? ? ? ? ?Right Malar Cheek ?Pearly telangiectatic 7 mm papule ? ? ? ? ? ? ?All skin waist up examined. ? ? ?Assessment & Plan  ? ? ?Neoplasm of uncertain behavior of skin (2) ?Left Upper Arm - Posterior ? ?Skin / nail biopsy ?Type of biopsy: tangential   ?Informed consent: discussed and consent obtained   ?Timeout: patient name, date of birth, surgical site, and procedure verified   ?Anesthesia: the lesion was anesthetized in a standard fashion   ?Anesthetic:  1% lidocaine w/ epinephrine 1-100,000 local infiltration ?Instrument used: flexible razor blade   ?Hemostasis achieved with: ferric subsulfate   ?Outcome: patient tolerated procedure well   ?Post-procedure details: wound care instructions given   ? ?Specimen 1 - Surgical pathology ?Differential Diagnosis: scc vs bcc ? ?Check Margins: No ? ?Right Malar Cheek ? ?Skin / nail biopsy ?Type of biopsy: tangential   ?Informed consent: discussed and consent obtained   ?Timeout: patient name, date of birth, surgical site, and procedure verified   ?Anesthesia: the lesion was anesthetized in a standard fashion   ?Anesthetic:  1% lidocaine w/ epinephrine 1-100,000 local infiltration ?Instrument used: flexible razor blade   ?Hemostasis achieved with: ferric subsulfate   ?Outcome:  patient tolerated procedure well   ?Post-procedure details: wound care instructions given   ? ?Specimen 2 - Surgical pathology ?Differential Diagnosis: scc vs bcc ? ?Check Margins: No ? ?Screening for malignant neoplasm of skin ? ?Yearly skin exams ? ? ? ? ? ?I, [name], MD, have reviewed all documentation for this visit.  The documentation on 11/25/21 for the exam, diagnosis, procedures, and orders are all accurate and complete. ?

## 2021-11-26 NOTE — Telephone Encounter (Signed)
Phone call to patient with his pathology results. Unable to reach patient or leave a message. After multiple attempts to reach the patient we will wait for the patient to give [place] a call back.  ?

## 2021-11-26 NOTE — Telephone Encounter (Signed)
-----   Message from [name], MD sent at 11/13/2021  6:51 AM EDT ----- ?I can treat these in 2 visits, but he now lives a good distance away.  If he would prefer, we can arrange with a Mohs surgeon closer to him for the face lesion. ?
